# Patient Record
Sex: Male | Born: 2000 | Hispanic: No | Marital: Single | Smoking: Never smoker
Health system: Southern US, Community
[De-identification: ages and names within clinical notes are randomized; demographics above are authoritative.]

## PROBLEM LIST (undated history)

## (undated) DIAGNOSIS — N200 Calculus of kidney: Secondary | ICD-10-CM

---
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
## 2019-06-01 ENCOUNTER — Ambulatory Visit (HOSPITAL_COMMUNITY)
Admission: EM | Admit: 2019-06-01 | Discharge: 2019-06-01 | Disposition: A | Discharge status: Home / Self Care | Payer: Medicaid Other | Attending: Family Medicine | Admitting: Family Medicine

## 2019-06-01 ENCOUNTER — Encounter (HOSPITAL_COMMUNITY): Payer: Self-pay

## 2019-06-01 ENCOUNTER — Other Ambulatory Visit: Payer: Self-pay

## 2019-06-01 DIAGNOSIS — U071 COVID-19: Secondary | ICD-10-CM | POA: Diagnosis not present

## 2019-06-01 DIAGNOSIS — R05 Cough: Secondary | ICD-10-CM | POA: Insufficient documentation

## 2019-06-01 DIAGNOSIS — Z20828 Contact with and (suspected) exposure to other viral communicable diseases: Secondary | ICD-10-CM | POA: Diagnosis not present

## 2019-06-01 DIAGNOSIS — J069 Acute upper respiratory infection, unspecified: Secondary | ICD-10-CM | POA: Diagnosis present

## 2019-06-01 NOTE — ED Provider Notes (Signed)
[facility]   [id number] 06/01/19 Arrival Time: 1306  ASSESSMENT & PLAN:  1. Viral URI with cough     COVID-19 testing sent. See work note for self-quarantine instructions. If requested, work note provided.  Follow-up Information    [facility]   Specialty: Urgent Care Why: As needed. Contact information: [address] [place] [address] [phone]          Reviewed expectations re: course of current medical issues. Questions answered. Outlined signs and symptoms indicating need for more acute intervention. Patient verbalized understanding. After Visit Summary given.   SUBJECTIVE: History from: patient. Mason Mendez is a 18 y.o. male who requests COVID-19 testing. Known COVID-19 contact: none known. Recent travel: none. Denies: sore throat, difficulty breathing and headache. Does reports mild nasal congestion/runny nose, decreased taste, and a mild dry cough that started today. Normal PO intake without n/v/d.  ROS: As per HPI.   OBJECTIVE:  Vitals:   06/01/19 1409  BP: (!) 152/84  Pulse: 77  Resp: 16  Temp: 98.2 F (36.8 C)  SpO2: 100%    General appearance: alert; no distress Eyes: PERRLA; EOMI; conjunctiva normal HENT: [place]; AT; nasal mucosa normal; oral mucosa normal Neck: supple  Lungs: speaks full sentences without difficulty; unlabored Heart: regular rate and rhythm Abdomen: soft, non-tender Extremities: no edema Skin: warm and dry Neurologic: normal gait Psychological: alert and cooperative; normal mood and affect  Labs:  Labs Reviewed  NOVEL CORONAVIRUS, NAA (HOSP ORDER, SEND-OUT TO REF LAB; TAT 18-24 HRS)      No Known Allergies  Past Medical History:  Diagnosis Date  . Kidney stone    Social History   Socioeconomic History  . Marital status: Single    Spouse name: Not on file  . Number of children: Not on file  . Years of education: Not on file  .  Highest education level: Not on file  Occupational History  . Not on file  Tobacco Use  . Smoking status: Never Smoker  . Smokeless tobacco: Never Used  Substance and Sexual Activity  . Alcohol use: No  . Drug use: Not on file  . Sexual activity: Not on file  Other Topics Concern  . Not on file  Social History Narrative  . Not on file   Social Determinants of Health   Financial Resource Strain:   . Difficulty of Paying Living Expenses: Not on file  Food Insecurity:   . Worried About Programme researcher, broadcasting/film/video in the Last Year: Not on file  . Ran Out of Food in the Last Year: Not on file  Transportation Needs:   . Lack of Transportation (Medical): Not on file  . Lack of Transportation (Non-Medical): Not on file  Physical Activity:   . Days of Exercise per Week: Not on file  . Minutes of Exercise per Session: Not on file  Stress:   . Feeling of Stress : Not on file  Social Connections:   . Frequency of Communication with Friends and Family: Not on file  . Frequency of Social Gatherings with Friends and Family: Not on file  . Attends Religious Services: Not on file  . Active Member of Clubs or Organizations: Not on file  . Attends Banker Meetings: Not on file  . Marital Status: Not on file  Intimate Partner Violence:   . Fear of Current or Ex-Partner: Not on file  . Emotionally Abused: Not on file  .  Physically Abused: Not on file  . Sexually Abused: Not on file   Family History  Problem Relation Age of Onset  . Healthy Mother   . Healthy Father    History reviewed. No pertinent surgical history.   Vanessa Kick, MD 06/01/19 1447

## 2019-06-01 NOTE — Discharge Instructions (Addendum)
If your Covid-19 test is positive, you will receive a phone call from  regarding your results. Negative test results are not called. Both positive and negative results area always visible on MyChart. If you do not have a MyChart account, sign up instructions are in your discharge papers.  

## 2019-06-01 NOTE — ED Triage Notes (Signed)
Pt present coughing, runny nose and lose of taste. Symptoms started today.

## 2019-06-03 ENCOUNTER — Telehealth: Payer: Self-pay | Admitting: Emergency Medicine

## 2019-06-03 LAB — NOVEL CORONAVIRUS, NAA (HOSP ORDER, SEND-OUT TO REF LAB; TAT 18-24 HRS): SARS-CoV-2, NAA: DETECTED — AB

## 2019-06-03 NOTE — Telephone Encounter (Signed)
